# Patient Record
Sex: Female | Born: 1985 | Race: White | Hispanic: No | Marital: Single | Smoking: Former smoker
Health system: Southern US, Community
[De-identification: ages and names within clinical notes are randomized; demographics above are authoritative.]

## PROBLEM LIST (undated history)

## (undated) ENCOUNTER — Inpatient Hospital Stay (HOSPITAL_COMMUNITY): Payer: Self-pay

## (undated) DIAGNOSIS — IMO0002 Reserved for concepts with insufficient information to code with codable children: Secondary | ICD-10-CM

## (undated) DIAGNOSIS — E282 Polycystic ovarian syndrome: Secondary | ICD-10-CM

## (undated) DIAGNOSIS — J45909 Unspecified asthma, uncomplicated: Secondary | ICD-10-CM

## (undated) HISTORY — PX: WISDOM TOOTH EXTRACTION: SHX21

## (undated) HISTORY — PX: DILATION AND CURETTAGE OF UTERUS: SHX78

## (undated) HISTORY — DX: Polycystic ovarian syndrome: E28.2

---

## 2004-05-03 HISTORY — PX: DILATION AND CURETTAGE OF UTERUS: SHX78

## 2012-02-29 ENCOUNTER — Other Ambulatory Visit: Payer: Self-pay | Admitting: Obstetrics & Gynecology

## 2012-02-29 ENCOUNTER — Other Ambulatory Visit: Payer: Self-pay | Admitting: *Deleted

## 2012-03-01 ENCOUNTER — Inpatient Hospital Stay (HOSPITAL_COMMUNITY): Payer: Medicaid Other

## 2012-03-01 ENCOUNTER — Encounter (HOSPITAL_COMMUNITY): Payer: Self-pay | Admitting: *Deleted

## 2012-03-01 ENCOUNTER — Inpatient Hospital Stay (HOSPITAL_COMMUNITY)
Admission: EM | Admit: 2012-03-01 | Discharge: 2012-03-01 | Disposition: A | Discharge status: Home / Self Care | Payer: Medicaid Other | Attending: Emergency Medicine | Admitting: Emergency Medicine

## 2012-03-01 DIAGNOSIS — Z043 Encounter for examination and observation following other accident: Secondary | ICD-10-CM

## 2012-03-01 DIAGNOSIS — W010XXA Fall on same level from slipping, tripping and stumbling without subsequent striking against object, initial encounter: Secondary | ICD-10-CM | POA: Insufficient documentation

## 2012-03-01 DIAGNOSIS — Z79899 Other long term (current) drug therapy: Secondary | ICD-10-CM | POA: Insufficient documentation

## 2012-03-01 DIAGNOSIS — W19XXXA Unspecified fall, initial encounter: Secondary | ICD-10-CM

## 2012-03-01 DIAGNOSIS — Y92009 Unspecified place in unspecified non-institutional (private) residence as the place of occurrence of the external cause: Secondary | ICD-10-CM | POA: Insufficient documentation

## 2012-03-01 DIAGNOSIS — R109 Unspecified abdominal pain: Secondary | ICD-10-CM

## 2012-03-01 DIAGNOSIS — O9989 Other specified diseases and conditions complicating pregnancy, childbirth and the puerperium: Secondary | ICD-10-CM | POA: Insufficient documentation

## 2012-03-01 DIAGNOSIS — Z3490 Encounter for supervision of normal pregnancy, unspecified, unspecified trimester: Secondary | ICD-10-CM

## 2012-03-01 HISTORY — DX: Reserved for concepts with insufficient information to code with codable children: IMO0002

## 2012-03-01 HISTORY — DX: Unspecified asthma, uncomplicated: J45.909

## 2012-03-01 MED ORDER — OXYCODONE-ACETAMINOPHEN 5-325 MG PO TABS
1.0000 | ORAL_TABLET | Freq: Once | ORAL | Status: AC
  Administered 2012-03-01: 1 via ORAL
  Filled 2012-03-01: qty 1

## 2012-03-01 NOTE — ED Notes (Signed)
Rapid response OB nurse in to assess

## 2012-03-01 NOTE — ED Provider Notes (Signed)
History     CSN: [id number]  Arrival date & time 03/01/12  1730   First MD Initiated Contact with Patient 03/01/12 1737       chief complaint: Fall/[redacted] weeks pregnant  The history is provided by the patient.   the patient reports she was walking in her house when she fell down the stairs.  She slid down on her bottom and hit all of the last 5 steps on her way down.  No loss consciousness.  She did not strike her head.  She denies neck pain.  She has no weakness in her upper lower extremities.  She reports developing mild lower abdominal pain after this.  She is a G4 P1 a 2 who is [redacted] weeks pregnant.  No loss of fluid.  No vaginal bleeding.  She has not felt the baby move since the event.  She reports she normally only feels the baby move at night.  Fetal heart rate on arrival to the emergency department looks good in the 140s 150s.  This was a level II trauma code.  She reports only mild lower abdominal pain at this time.  No other complaints  No past medical history on file.  No past surgical history on file.  No family history on file.  History  Substance Use Topics  . Smoking status: Not on file  . Smokeless tobacco: Not on file  . Alcohol Use: Not on file    OB History    No data available      Review of Systems  All other systems reviewed and are negative.    Allergies  Review of patient's allergies indicates no known allergies.  Home Medications   Current Outpatient Rx  Name Route Sig Dispense Refill  . PRENATAL 27-0.8 MG PO TABS Oral Take 1 tablet by mouth daily.      BP 119/68  Pulse 66  Temp 98.3 F (36.8 C) (Oral)  Resp 15  SpO2 99%  Physical Exam  Nursing note and vitals reviewed. Constitutional: She is oriented to person, place, and time. She appears well-developed and well-nourished. No distress.  HENT:  Head: Normocephalic and atraumatic.  Eyes: EOM are normal.  Neck: Normal range of motion.  Cardiovascular: Normal rate, regular rhythm and  normal heart sounds.   Pulmonary/Chest: Effort normal and breath sounds normal.  Abdominal: Soft. She exhibits no distension. There is no tenderness.       Gravid uterus consistent with dates  Musculoskeletal: Normal range of motion.  Neurological: She is alert and oriented to person, place, and time.  Skin: Skin is warm and dry.  Psychiatric: She has a normal mood and affect. Judgment normal.    ED Course  Procedures (including critical care time)  Labs Reviewed - No data to display No results found.   1. Fall   2. Abdominal pain   3. Pregnant       MDM  Patient is clear from a trauma standpoint.  C-spine cleared by Nexus criteria.  No loss consciousness.  Doubt intra-abdominal injury from the fall such as solid organ injury.  Concern for possible placental abruption given her lower abdominal pain.  Baby looks good on fetal monitoring.  The patient will be transferred to [facility] hospital for ongoing fetal monitoring and tocometry  Accepted in transfer by Dr Tamela Oddi        Lyanne Co, MD 03/01/12 [phone]

## 2012-03-01 NOTE — ED Notes (Signed)
Lupita Leash Consulting civil engineer at [facility] MAU notified of patient to be transferred to MAU for further evaluation per Dr. Tamela Oddi.

## 2012-03-01 NOTE — ED Notes (Signed)
Per OB rapid response nurse, pt to be transported to [place] for [place] - accepting MD, Dr. Tamela Oddi

## 2012-03-01 NOTE — ED Notes (Signed)
Notified Dr. Tamela Oddi of patient status Patient to be transferred to [facility] MAU for ultrasound

## 2012-03-01 NOTE — MAU Note (Signed)
Arrived via carelink from [facility]. 20 wks, s/p fall, for U/S.

## 2012-03-01 NOTE — Progress Notes (Signed)
Orthopedic Tech Progress Note Patient Details:  Sheila Brooks [birth year] [id number]  Patient ID: [name], female   DOB: 07-09-1985, 26 y.o.   MRN: [id number] Made trauma visit  Nikki Dom 03/01/2012, 5:44 PM

## 2012-03-01 NOTE — ED Notes (Addendum)
Reports mechanical fall down approx 5 wooden staps - initially landed on bottom then bounced down remaining stairs; no LOC; c/o pain to lower abd - reports is 5 mos preg (G 4, P 1, Ab 2); reports has felt baby move prior to fall but not since; however, states normally feels fetal movement at night; denies any back nor neck pain; no obvious injuries noted; abd obese, soft, nontender; denies any leakage of fluid nor blood; further reports does not feel as if there is anything protruding from vaginal area; states first pregnancy was vaginal delivery; however; did have placenta previa; denies any complications with this pregnancy

## 2012-03-01 NOTE — MAU Provider Note (Signed)
  History     CSN: [id number]  Arrival date and time: 03/01/12 1730   First Provider Initiated Contact with Patient 03/01/12 1857      Chief Complaint  Patient presents with  . Fall   HPI Sheila Brooks 26 y.o. [redacted]w[redacted]d   Client had a fall today where she slipped and sat down on the steps while going down the stairs.  Went to [facility] for evaluation.  Then comes to MAU via carelink per orders of Dr. Tamela Oddi.  Denies any leaking or bleeding.  Is having a constant pain in low midline - "like a headache".  Client confirms the pain is not like cramping but constant in nature.   OB History    Grav Para Term Preterm Abortions TAB SAB Ect Mult Living   1               No past medical history on file.  No past surgical history on file.  No family history on file.  History  Substance Use Topics  . Smoking status: Not on file  . Smokeless tobacco: Not on file  . Alcohol Use: Not on file    Allergies: No Known Allergies  Prescriptions prior to admission  Medication Sig Dispense Refill  . Prenatal Vit-Fe Fumarate-FA (MULTIVITAMIN-PRENATAL) 27-0.8 MG TABS Take 1 tablet by mouth daily.        Review of Systems  Gastrointestinal: Positive for abdominal pain. Negative for nausea and vomiting.  Genitourinary:       No vaginal discharge. No vaginal bleeding. No dysuria.   Physical Exam   Blood pressure 116/62, pulse 72, temperature 98.2 F (36.8 C), temperature source Oral, resp. rate 18, height 5' 3.5" (1.613 m), weight 94.348 kg (208 lb), SpO2 100.00%.  Physical Exam  Nursing note and vitals reviewed. Constitutional: She is oriented to person, place, and time. She appears well-developed and well-nourished.  HENT:  Head: Normocephalic.  Eyes: EOM are normal.  Neck: Neck supple.  GI: Soft. There is tenderness. There is no rebound and no guarding.  Musculoskeletal: Normal range of motion.  Neurological: She is alert and oriented to person, place, and time.  Skin: Skin is  warm and dry.  Psychiatric: She has a normal mood and affect.    MAU Course  Procedures  MDM Care assumed by H. Mathews Robinsons, CNM at 2000  USE:  Clinical Data: Pregnancy with fall. Pain.  LIMITED OBSTETRIC ULTRASOUND  Number of Fetuses: 1  Heart Rate: 136 bpm  Movement: Present  Breathing: Not demonstrated  Presentation: Cephalic  Placental Location: Anterior  Previa: No  Amniotic Fluid (Subjective): Normal  Vertical Pocket 4.03 cm  BPD: 5.07 cm 21 w 3 d EDC: 07/09/2012  MATERNAL FINDINGS:  Cervix: 4.1 cm. Closed.  Uterus/Adnexae: Both ovaries grossly normal  IMPRESSION:  Normal appearing limited exam. Single living intrauterine  gestation at 21 weeks 3 days. No identifiable complication.  Recommend followup with non-emergent complete OB 14+ wk US  examination for fetal biometric evaluation and anatomic survey if  not already performed.    2111: Spoke with Dr. Tamela Oddi, OK to DC patient home.  Assessment and Plan   1. Fall   2. Abdominal pain   3. Pregnant    Danger signs reviewed FU with Dr. Tamela Oddi as scheduled.   BURLESON,TERRI 03/01/2012, 7:02 PM

## 2012-03-23 ENCOUNTER — Other Ambulatory Visit: Payer: Self-pay | Admitting: Obstetrics & Gynecology

## 2012-03-23 LAB — OB RESULTS CONSOLE GC/CHLAMYDIA
Chlamydia: NEGATIVE
Gonorrhea: NEGATIVE

## 2012-03-23 LAB — OB RESULTS CONSOLE ABO/RH

## 2012-03-23 LAB — OB RESULTS CONSOLE HIV ANTIBODY (ROUTINE TESTING): HIV: NONREACTIVE

## 2012-03-23 LAB — OB RESULTS CONSOLE RPR: RPR: NONREACTIVE

## 2012-03-23 LAB — OB RESULTS CONSOLE RUBELLA ANTIBODY, IGM: Rubella: IMMUNE

## 2012-05-15 ENCOUNTER — Inpatient Hospital Stay (HOSPITAL_COMMUNITY)
Admission: AD | Admit: 2012-05-15 | Discharge: 2012-05-15 | Disposition: A | Discharge status: Home / Self Care | Payer: Medicaid Other | Source: Ambulatory Visit | Attending: Obstetrics & Gynecology | Admitting: Obstetrics & Gynecology

## 2012-05-15 DIAGNOSIS — Z2989 Encounter for other specified prophylactic measures: Secondary | ICD-10-CM | POA: Insufficient documentation

## 2012-05-15 DIAGNOSIS — Z298 Encounter for other specified prophylactic measures: Secondary | ICD-10-CM | POA: Insufficient documentation

## 2012-05-15 MED ORDER — RHO D IMMUNE GLOBULIN 1500 UNIT/2ML IJ SOLN
300.0000 ug | Freq: Once | INTRAMUSCULAR | Status: AC
  Administered 2012-05-15: 300 ug via INTRAMUSCULAR
  Filled 2012-05-15: qty 2

## 2012-05-15 NOTE — MAU Note (Signed)
RHophyllac/RH factor info given to pt.  Pt has received previously, no  Problems.  NKDA.  Time associated between blood draw and injection discussed.

## 2012-05-16 LAB — RH IG WORKUP (INCLUDES ABO/RH)
Antibody Screen: NEGATIVE
Fetal Screen: NEGATIVE
Gestational Age(Wks): 32

## 2012-06-10 ENCOUNTER — Inpatient Hospital Stay (HOSPITAL_COMMUNITY)
Admission: AD | Admit: 2012-06-10 | Discharge: 2012-06-11 | Disposition: A | Discharge status: Home / Self Care | Payer: Medicaid Other | Source: Ambulatory Visit | Attending: Obstetrics & Gynecology | Admitting: Obstetrics & Gynecology

## 2012-06-10 DIAGNOSIS — O4700 False labor before 37 completed weeks of gestation, unspecified trimester: Secondary | ICD-10-CM | POA: Insufficient documentation

## 2012-06-10 DIAGNOSIS — R109 Unspecified abdominal pain: Secondary | ICD-10-CM | POA: Insufficient documentation

## 2012-06-10 DIAGNOSIS — O479 False labor, unspecified: Secondary | ICD-10-CM

## 2012-06-10 LAB — URINALYSIS, ROUTINE W REFLEX MICROSCOPIC
Glucose, UA: NEGATIVE mg/dL
Hgb urine dipstick: NEGATIVE
pH: 6 (ref 5.0–8.0)

## 2012-06-10 LAB — URINE MICROSCOPIC-ADD ON

## 2012-06-10 NOTE — Progress Notes (Signed)
walidah cnm notified of SVE result.

## 2012-06-10 NOTE — MAU Note (Signed)
Patient is in with c/o constant lower abdominal cramping. 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 She denies vaginal bleeding or lof. She reports good fetal movement.

## 2012-06-11 ENCOUNTER — Encounter (HOSPITAL_COMMUNITY): Payer: Self-pay | Admitting: Family

## 2012-06-11 DIAGNOSIS — O479 False labor, unspecified: Secondary | ICD-10-CM

## 2012-06-11 NOTE — MAU Provider Note (Signed)
  History     CSN: [id number]  Arrival date and time: 06/10/12 2254   None     Chief Complaint  Patient presents with  . Abdominal Cramping   Abdominal Cramping   Pt is a [id number] here at 36 wks IUP here with report of cramping that started last night.  Pt denies feeling contraction, vaginal bleeding, or leaking of fluid.  Desired to have cervix checked.     Past Medical History  Diagnosis Date  . Asthma     seasonal  . Abnormal Pap smear     Past Surgical History  Procedure Laterality Date  . Dilation and curettage of uterus    . Wisdom tooth extraction      No family history on file.  History  Substance Use Topics  . Smoking status: Current Every Day Smoker -- 0.25 packs/day    Types: Cigarettes  . Smokeless tobacco: Never Used  . Alcohol Use: No    Allergies: No Known Allergies  Prescriptions prior to admission  Medication Sig Dispense Refill  . Prenatal Vit-Fe Fumarate-FA (MULTIVITAMIN-PRENATAL) 27-0.8 MG TABS Take 1 tablet by mouth daily.        Review of Systems  Gastrointestinal: Positive for abdominal pain (cramping).  All other systems reviewed and are negative.   Physical Exam   Blood pressure 121/70, pulse 96, temperature 97.6 F (36.4 C), temperature source Oral, resp. rate 18, height 5' 3.5" (1.613 m), weight 108.92 kg (240 lb 2 oz).  Physical Exam  Constitutional: She is oriented to person, place, and time. She appears well-developed and well-nourished. No distress.  HENT:  Head: Normocephalic.  Neck: Normal range of motion. Neck supple.  Cardiovascular: Normal rate, regular rhythm and normal heart sounds.   Respiratory: Effort normal and breath sounds normal.  GI: Soft. There is no tenderness.  Genitourinary: No bleeding around the vagina. Vaginal discharge (mucusy) found.  Neurological: She is alert and oriented to person, place, and time.  Skin: Skin is warm and dry.  Dilation: Closed Effacement (%): Thick Cervical Position:  Posterior Station: Ballotable Exam by:: Peace, rn   MAU Course  Procedures  Results for orders placed during the hospital encounter of 06/10/12 (from the past 24 hour(s))  URINALYSIS, ROUTINE W REFLEX MICROSCOPIC     Status: Abnormal   Collection Time    06/10/12 11:02 PM      Result Value Range   Color, Urine YELLOW  YELLOW   APPearance CLEAR  CLEAR   Specific Gravity, Urine 1.020  1.005 - 1.030   pH 6.0  5.0 - 8.0   Glucose, UA NEGATIVE  NEGATIVE mg/dL   Hgb urine dipstick NEGATIVE  NEGATIVE   Bilirubin Urine NEGATIVE  NEGATIVE   Ketones, ur NEGATIVE  NEGATIVE mg/dL   Protein, ur NEGATIVE  NEGATIVE mg/dL   Urobilinogen, UA 0.2  0.0 - 1.0 mg/dL   Nitrite NEGATIVE  NEGATIVE   Leukocytes, UA SMALL (*) NEGATIVE  URINE MICROSCOPIC-ADD ON     Status: Abnormal   Collection Time    06/10/12 11:02 PM      Result Value Range   Squamous Epithelial / LPF FEW (*) RARE   WBC, UA 3-6  <3 WBC/hpf   Bacteria, UA FEW (*) RARE   Urine-Other MUCOUS PRESENT     FHR 120's, +accel, reactive Toco - irregular Assessment and Plan  Braxton Hicks  Plan: DC to home Preterm labor precautions Keep scheduled appointment  [facility] 06/11/2012, 12:41 AM

## 2012-06-12 LAB — OB RESULTS CONSOLE GBS: GBS: NEGATIVE

## 2012-06-12 LAB — URINE CULTURE

## 2012-06-26 ENCOUNTER — Encounter (HOSPITAL_COMMUNITY): Payer: Self-pay | Admitting: *Deleted

## 2012-06-26 ENCOUNTER — Telehealth (HOSPITAL_COMMUNITY): Payer: Self-pay | Admitting: *Deleted

## 2012-06-26 NOTE — Telephone Encounter (Signed)
Preadmission screen  

## 2012-07-09 ENCOUNTER — Inpatient Hospital Stay (HOSPITAL_COMMUNITY)
Admission: AD | Admit: 2012-07-09 | Discharge: 2012-07-10 | DRG: 780 | Disposition: A | Discharge status: Home / Self Care | Payer: Medicaid Other | Source: Ambulatory Visit | Attending: Obstetrics & Gynecology | Admitting: Obstetrics & Gynecology

## 2012-07-09 ENCOUNTER — Encounter (HOSPITAL_COMMUNITY): Payer: Self-pay | Admitting: *Deleted

## 2012-07-09 DIAGNOSIS — O479 False labor, unspecified: Principal | ICD-10-CM | POA: Diagnosis present

## 2012-07-09 LAB — CBC
HCT: 32.2 % — ABNORMAL LOW (ref 36.0–46.0)
MCV: 95.3 fL (ref 78.0–100.0)
RBC: 3.38 MIL/uL — ABNORMAL LOW (ref 3.87–5.11)
WBC: 11.6 10*3/uL — ABNORMAL HIGH (ref 4.0–10.5)

## 2012-07-09 MED ORDER — IBUPROFEN 600 MG PO TABS: 600.0000 mg | ORAL_TABLET | Freq: Four times a day (QID) | ORAL | Status: DC | PRN

## 2012-07-09 MED ORDER — ACETAMINOPHEN 325 MG PO TABS: 650.0000 mg | ORAL_TABLET | ORAL | Status: DC | PRN

## 2012-07-09 MED ORDER — CITRIC ACID-SODIUM CITRATE 334-500 MG/5ML PO SOLN: 30.0000 mL | ORAL | Status: DC | PRN

## 2012-07-09 MED ORDER — OXYTOCIN BOLUS FROM INFUSION: 500.0000 mL | INTRAVENOUS | Status: DC

## 2012-07-09 MED ORDER — BUTORPHANOL TARTRATE 1 MG/ML IJ SOLN
1.0000 mg | INTRAMUSCULAR | Status: DC | PRN
  Administered 2012-07-09: 1 mg via INTRAVENOUS
  Filled 2012-07-09: qty 1

## 2012-07-09 MED ORDER — OXYTOCIN 40 UNITS IN LACTATED RINGERS INFUSION - SIMPLE MED
62.5000 mL/h | INTRAVENOUS | Status: DC
  Filled 2012-07-09 (×2): qty 1000

## 2012-07-09 MED ORDER — ZOLPIDEM TARTRATE 5 MG PO TABS
5.0000 mg | ORAL_TABLET | Freq: Every evening | ORAL | Status: DC | PRN
  Administered 2012-07-09: 5 mg via ORAL
  Filled 2012-07-09: qty 1

## 2012-07-09 MED ORDER — LACTATED RINGERS IV SOLN
INTRAVENOUS | Status: DC
  Administered 2012-07-09 (×2): via INTRAVENOUS

## 2012-07-09 MED ORDER — ONDANSETRON HCL 4 MG/2ML IJ SOLN: 4.0000 mg | Freq: Four times a day (QID) | INTRAMUSCULAR | Status: DC | PRN

## 2012-07-09 MED ORDER — LACTATED RINGERS IV SOLN: 500.0000 mL | INTRAVENOUS | Status: DC | PRN

## 2012-07-09 MED ORDER — OXYTOCIN 40 UNITS IN LACTATED RINGERS INFUSION - SIMPLE MED
1.0000 m[IU]/min | INTRAVENOUS | Status: DC
  Administered 2012-07-09: 1 m[IU]/min via INTRAVENOUS

## 2012-07-09 MED ORDER — TERBUTALINE SULFATE 1 MG/ML IJ SOLN: 0.2500 mg | Freq: Once | INTRAMUSCULAR | Status: AC | PRN

## 2012-07-09 MED ORDER — LIDOCAINE HCL (PF) 1 % IJ SOLN: 30.0000 mL | INTRAMUSCULAR | Status: DC | PRN

## 2012-07-09 MED ORDER — OXYCODONE-ACETAMINOPHEN 5-325 MG PO TABS: 1.0000 | ORAL_TABLET | ORAL | Status: DC | PRN

## 2012-07-09 NOTE — Progress Notes (Signed)
Dr. Gaynell Face called to check on pt--orders to allow pt to come off monitors to walk then reevaluate SVE

## 2012-07-09 NOTE — Progress Notes (Addendum)
Orders to stop pitocin at 7pm--allow pt to eat & shower--restart pitocin per MD orders 1by 1 until a max dose of 6 milli-units then hold--NST 2 times per shift and vitals when awake while pitocin is off

## 2012-07-09 NOTE — H&P (Signed)
This is Dr. Francoise Ceo dictating the history and physical on blank blank she's a 27 year old gravida 4 para 1021 at 40 weeks EDC 07/09/2012 she was admitted in labor report to 4 cm membranes intact with a vertex -3 cinches been admitted her contractions have become milder and her GBS is negative Past medical history negative Past surgical history negative Social history negative System review negative Physical exam revealed a well-developed female in early labor HEENT negative Breasts negative Lungs clear to P&A Heart regular rhythm no murmurs no gallops Abdomen term Extremities negative

## 2012-07-09 NOTE — MAU Note (Signed)
Contractions since 2030 

## 2012-07-09 NOTE — Progress Notes (Signed)
Monitors removed for pt to walk

## 2012-07-10 ENCOUNTER — Inpatient Hospital Stay (HOSPITAL_COMMUNITY): Payer: Medicaid Other

## 2012-07-10 NOTE — Progress Notes (Signed)
Sheila Brooks is a 27 y.o. [phone] at [redacted]w[redacted]d by LMP admitted for active labor  Subjective: Comfortable  Objective: BP 116/70  Pulse 72  Temp(Src) 98 F (36.7 C) (Oral)  Resp 18  Ht 5\' 3"  (1.6 m)  Wt 110.043 kg (242 lb 9.6 oz)  BMI 42.99 kg/m2  SpO2 99%      FHT:  FHR: 140 bpm, variability: moderate,  accelerations:  Present,  decelerations:  Absent UC:   irregular, every 5 minutes SVE:   Dilation: 2.5 Effacement (%): Thick Station: -3;-2 Exam by:: [employer]: Lab Results  Component Value Date   WBC 11.6* 07/09/2012   HGB 11.1* 07/09/2012   HCT 32.2* 07/09/2012   MCV 95.3 07/09/2012   PLT 295 07/09/2012    Assessment / Plan: False labor  Labor: See above; d/c Pitocin; U/S for AFI; if AFI OK-->d/c home, IOL @ 41 weeks Preeclampsia:  n/a Fetal Wellbeing:  Category I Pain Control:  n/a I/D:  n/a Anticipated MOD:  NSVD  JACKSON-MOORE,LISA A 07/10/2012, 9:22 AM

## 2012-07-11 ENCOUNTER — Encounter (HOSPITAL_COMMUNITY): Payer: Self-pay | Admitting: *Deleted

## 2012-07-11 ENCOUNTER — Inpatient Hospital Stay (HOSPITAL_COMMUNITY)
Admission: AD | Admit: 2012-07-11 | Discharge: 2012-07-15 | DRG: 766 | Disposition: A | Discharge status: Home / Self Care | Payer: Medicaid Other | Source: Ambulatory Visit | Attending: Obstetrics | Admitting: Obstetrics

## 2012-07-11 DIAGNOSIS — Z98891 History of uterine scar from previous surgery: Secondary | ICD-10-CM

## 2012-07-11 DIAGNOSIS — O480 Post-term pregnancy: Secondary | ICD-10-CM | POA: Diagnosis present

## 2012-07-11 NOTE — Discharge Summary (Signed)
  Physician Discharge Summary  Patient ID: Sheila Brooks MRN: [id number] DOB/AGE: 29/07/1985 28 y.o.  Admit date: 07/09/2012 Discharge date: 07/11/2012  Admission Diagnoses: Active labor  Discharge Diagnoses:  False labor  Discharged Condition: stable  Hospital Course: There was no progressive cervical change despite attempts to augment labor with low dose Pitocin.  An AFI on the day of discharge was normal.  Consults: None  Significant Diagnostic Studies: radiology: Ultrasound: see above  Treatments: see above  Discharge Exam: Blood pressure 116/70, pulse 72, temperature 98 F (36.7 C), temperature source Oral, resp. rate 18, height 5\' 3"  (1.6 m), weight 110.043 kg (242 lb 9.6 oz), SpO2 99.00%. General appearance: alert GI: NT  Disposition: 01-Home or Self Care   Future Appointments Lailana Shira Department Dept Phone   07/16/2012 7:00 AM Wh-Bssched Room [facility] [phone]       Medication List    ASK your doctor about these medications       acetaminophen 500 MG tablet  Commonly known as:  TYLENOL  Take 1,000 mg by mouth daily as needed for pain.     nitrofurantoin (macrocrystal-monohydrate) 100 MG capsule  Commonly known as:  MACROBID  Take 100 mg by mouth 2 (two) times daily. 7  Day course     prenatal multivitamin Tabs  Take 1 tablet by mouth daily at 12 noon.           Follow-up Information   Follow up with [facility] On 07/16/2012. (for inductioin)    Contact information:   [address] [place] [address] [phone]      Signed: Roseanna Rainbow 07/11/2012, 9:16 PM

## 2012-07-11 NOTE — MAU Note (Signed)
Neysa Bonito ,RN in [employer] has agreed to triage the pt

## 2012-07-11 NOTE — MAU Note (Signed)
Pt states she had a gush of clear fluid at 1044pm

## 2012-07-12 ENCOUNTER — Inpatient Hospital Stay (HOSPITAL_COMMUNITY): Payer: Medicaid Other | Admitting: Anesthesiology

## 2012-07-12 ENCOUNTER — Encounter (HOSPITAL_COMMUNITY): Payer: Self-pay | Admitting: Anesthesiology

## 2012-07-12 ENCOUNTER — Encounter (HOSPITAL_COMMUNITY)
Admission: AD | Disposition: A | Discharge status: Home / Self Care | Payer: Self-pay | Source: Ambulatory Visit | Attending: Obstetrics

## 2012-07-12 ENCOUNTER — Encounter (HOSPITAL_COMMUNITY): Payer: Self-pay | Admitting: Obstetrics

## 2012-07-12 LAB — RPR: RPR Ser Ql: NONREACTIVE

## 2012-07-12 LAB — TYPE AND SCREEN
Antibody Screen: POSITIVE
Unit division: 0

## 2012-07-12 LAB — CBC
MCH: 33.1 pg (ref 26.0–34.0)
MCHC: 34.4 g/dL (ref 30.0–36.0)
Platelets: 300 10*3/uL (ref 150–400)

## 2012-07-12 SURGERY — Surgical Case
Anesthesia: Epidural | Site: Abdomen | Wound class: Clean Contaminated

## 2012-07-12 MED ORDER — LIDOCAINE HCL (PF) 1 % IJ SOLN: 30.0000 mL | INTRAMUSCULAR | Status: DC | PRN

## 2012-07-12 MED ORDER — OXYTOCIN BOLUS FROM INFUSION: 500.0000 mL | INTRAVENOUS | Status: DC

## 2012-07-12 MED ORDER — CEFAZOLIN SODIUM-DEXTROSE 2-3 GM-% IV SOLR: 2.0000 g | Freq: Three times a day (TID) | INTRAVENOUS | Status: DC

## 2012-07-12 MED ORDER — TERBUTALINE SULFATE 1 MG/ML IJ SOLN: 0.2500 mg | Freq: Once | INTRAMUSCULAR | Status: DC | PRN

## 2012-07-12 MED ORDER — ZOLPIDEM TARTRATE 5 MG PO TABS: 5.0000 mg | ORAL_TABLET | Freq: Every evening | ORAL | Status: DC | PRN

## 2012-07-12 MED ORDER — IBUPROFEN 600 MG PO TABS: 600.0000 mg | ORAL_TABLET | Freq: Four times a day (QID) | ORAL | Status: DC | PRN

## 2012-07-12 MED ORDER — ONDANSETRON HCL 4 MG PO TABS: 4.0000 mg | ORAL_TABLET | ORAL | Status: DC | PRN

## 2012-07-12 MED ORDER — SCOPOLAMINE 1 MG/3DAYS TD PT72
MEDICATED_PATCH | TRANSDERMAL | Status: AC
  Filled 2012-07-12: qty 1

## 2012-07-12 MED ORDER — CEFAZOLIN SODIUM-DEXTROSE 2-3 GM-% IV SOLR
INTRAVENOUS | Status: AC
  Filled 2012-07-12: qty 50

## 2012-07-12 MED ORDER — MEPERIDINE HCL 25 MG/ML IJ SOLN
INTRAMUSCULAR | Status: AC
  Filled 2012-07-12: qty 1

## 2012-07-12 MED ORDER — LACTATED RINGERS IV SOLN
INTRAVENOUS | Status: DC
  Administered 2012-07-12 (×2): via INTRAVENOUS

## 2012-07-12 MED ORDER — PROMETHAZINE HCL 25 MG/ML IJ SOLN: 25.0000 mg | Freq: Four times a day (QID) | INTRAMUSCULAR | Status: DC | PRN

## 2012-07-12 MED ORDER — EPHEDRINE 5 MG/ML INJ: 10.0000 mg | INTRAVENOUS | Status: DC | PRN

## 2012-07-12 MED ORDER — CEFAZOLIN SODIUM-DEXTROSE 2-3 GM-% IV SOLR
INTRAVENOUS | Status: DC | PRN
  Administered 2012-07-12: 2 g via INTRAVENOUS

## 2012-07-12 MED ORDER — GENTAMICIN SULFATE 40 MG/ML IJ SOLN
Freq: Once | INTRAMUSCULAR | Status: AC
  Administered 2012-07-12: 100 mL via INTRAVENOUS
  Filled 2012-07-12: qty 4.5

## 2012-07-12 MED ORDER — CLINDAMYCIN PHOSPHATE 900 MG/50ML IV SOLN: 900.0000 mg | Freq: Three times a day (TID) | INTRAVENOUS | Status: DC

## 2012-07-12 MED ORDER — SODIUM CHLORIDE 0.9 % IJ SOLN: 3.0000 mL | INTRAMUSCULAR | Status: DC | PRN

## 2012-07-12 MED ORDER — WITCH HAZEL-GLYCERIN EX PADS: 1.0000 "application " | MEDICATED_PAD | CUTANEOUS | Status: DC | PRN

## 2012-07-12 MED ORDER — DIPHENHYDRAMINE HCL 25 MG PO CAPS: 25.0000 mg | ORAL_CAPSULE | Freq: Four times a day (QID) | ORAL | Status: DC | PRN

## 2012-07-12 MED ORDER — ONDANSETRON HCL 4 MG/2ML IJ SOLN: 4.0000 mg | INTRAMUSCULAR | Status: DC | PRN

## 2012-07-12 MED ORDER — KETOROLAC TROMETHAMINE 30 MG/ML IJ SOLN
30.0000 mg | Freq: Four times a day (QID) | INTRAMUSCULAR | Status: AC | PRN
  Administered 2012-07-12: 30 mg via INTRAVENOUS

## 2012-07-12 MED ORDER — LACTATED RINGERS IV SOLN: INTRAVENOUS | Status: DC

## 2012-07-12 MED ORDER — ONDANSETRON HCL 4 MG/2ML IJ SOLN: 4.0000 mg | Freq: Four times a day (QID) | INTRAMUSCULAR | Status: DC | PRN

## 2012-07-12 MED ORDER — SODIUM CHLORIDE 0.9 % IV SOLN
2.0000 g | Freq: Once | INTRAVENOUS | Status: AC
  Administered 2012-07-12: 2 g via INTRAVENOUS
  Filled 2012-07-12: qty 2000

## 2012-07-12 MED ORDER — PHENYLEPHRINE 40 MCG/ML (10ML) SYRINGE FOR IV PUSH (FOR BLOOD PRESSURE SUPPORT)
PREFILLED_SYRINGE | INTRAVENOUS | Status: AC
  Filled 2012-07-12: qty 10

## 2012-07-12 MED ORDER — DIPHENHYDRAMINE HCL 50 MG/ML IJ SOLN: 25.0000 mg | INTRAMUSCULAR | Status: DC | PRN

## 2012-07-12 MED ORDER — SODIUM BICARBONATE 8.4 % IV SOLN
INTRAVENOUS | Status: DC | PRN
  Administered 2012-07-12: 5 mL via EPIDURAL

## 2012-07-12 MED ORDER — LACTATED RINGERS IV SOLN
INTRAVENOUS | Status: DC | PRN
  Administered 2012-07-12 (×2): via INTRAVENOUS

## 2012-07-12 MED ORDER — FENTANYL CITRATE 0.05 MG/ML IJ SOLN: 25.0000 ug | INTRAMUSCULAR | Status: DC | PRN

## 2012-07-12 MED ORDER — LACTATED RINGERS IV SOLN
500.0000 mL | Freq: Once | INTRAVENOUS | Status: AC
  Administered 2012-07-12: 500 mL via INTRAVENOUS

## 2012-07-12 MED ORDER — SCOPOLAMINE 1 MG/3DAYS TD PT72
1.0000 | MEDICATED_PATCH | Freq: Once | TRANSDERMAL | Status: DC
  Administered 2012-07-12: 1.5 mg via TRANSDERMAL

## 2012-07-12 MED ORDER — ONDANSETRON HCL 4 MG/2ML IJ SOLN
INTRAMUSCULAR | Status: DC | PRN
  Administered 2012-07-12: 4 mg via INTRAVENOUS

## 2012-07-12 MED ORDER — NALBUPHINE SYRINGE 5 MG/0.5 ML: 10.0000 mg | INJECTION | INTRAMUSCULAR | Status: DC | PRN

## 2012-07-12 MED ORDER — CLINDAMYCIN PHOSPHATE 900 MG/50ML IV SOLN: 900.0000 mg | Freq: Once | INTRAVENOUS | Status: DC

## 2012-07-12 MED ORDER — SENNOSIDES-DOCUSATE SODIUM 8.6-50 MG PO TABS
2.0000 | ORAL_TABLET | Freq: Every day | ORAL | Status: DC
  Administered 2012-07-14: 2 via ORAL

## 2012-07-12 MED ORDER — LACTATED RINGERS IV SOLN
INTRAVENOUS | Status: DC
  Administered 2012-07-12: 150 mL/h via INTRAUTERINE

## 2012-07-12 MED ORDER — MAGNESIUM HYDROXIDE 400 MG/5ML PO SUSP: 30.0000 mL | ORAL | Status: DC | PRN

## 2012-07-12 MED ORDER — EPHEDRINE 5 MG/ML INJ
10.0000 mg | INTRAVENOUS | Status: DC | PRN
  Filled 2012-07-12: qty 4

## 2012-07-12 MED ORDER — METOCLOPRAMIDE HCL 5 MG/ML IJ SOLN: 10.0000 mg | Freq: Three times a day (TID) | INTRAMUSCULAR | Status: DC | PRN

## 2012-07-12 MED ORDER — PHENYLEPHRINE 40 MCG/ML (10ML) SYRINGE FOR IV PUSH (FOR BLOOD PRESSURE SUPPORT): 80.0000 ug | PREFILLED_SYRINGE | INTRAVENOUS | Status: DC | PRN

## 2012-07-12 MED ORDER — PHENYLEPHRINE HCL 10 MG/ML IJ SOLN
INTRAMUSCULAR | Status: DC | PRN
  Administered 2012-07-12: 80 ug via INTRAVENOUS
  Administered 2012-07-12 (×2): 40 ug via INTRAVENOUS
  Administered 2012-07-12: 80 ug via INTRAVENOUS
  Administered 2012-07-12: 40 ug via INTRAVENOUS

## 2012-07-12 MED ORDER — FLEET ENEMA 7-19 GM/118ML RE ENEM: 1.0000 | ENEMA | RECTAL | Status: DC | PRN

## 2012-07-12 MED ORDER — OXYCODONE-ACETAMINOPHEN 5-325 MG PO TABS: 1.0000 | ORAL_TABLET | ORAL | Status: DC | PRN

## 2012-07-12 MED ORDER — FENTANYL 2.5 MCG/ML BUPIVACAINE 1/10 % EPIDURAL INFUSION (WH - ANES)
14.0000 mL/h | INTRAMUSCULAR | Status: DC | PRN
  Administered 2012-07-12 (×3): 14 mL/h via EPIDURAL
  Filled 2012-07-12 (×3): qty 125

## 2012-07-12 MED ORDER — SIMETHICONE 80 MG PO CHEW
80.0000 mg | CHEWABLE_TABLET | ORAL | Status: DC | PRN
  Administered 2012-07-13 – 2012-07-14 (×2): 80 mg via ORAL

## 2012-07-12 MED ORDER — OXYTOCIN 40 UNITS IN LACTATED RINGERS INFUSION - SIMPLE MED: 62.5000 mL/h | INTRAVENOUS | Status: DC

## 2012-07-12 MED ORDER — CITRIC ACID-SODIUM CITRATE 334-500 MG/5ML PO SOLN
30.0000 mL | ORAL | Status: DC | PRN
  Administered 2012-07-12: 30 mL via ORAL

## 2012-07-12 MED ORDER — GENTAMICIN SULFATE 40 MG/ML IJ SOLN
Freq: Three times a day (TID) | INTRAVENOUS | Status: DC
  Administered 2012-07-13 – 2012-07-14 (×4): via INTRAVENOUS
  Filled 2012-07-12 (×5): qty 4.25

## 2012-07-12 MED ORDER — FERROUS SULFATE 325 (65 FE) MG PO TABS
325.0000 mg | ORAL_TABLET | Freq: Two times a day (BID) | ORAL | Status: DC
  Administered 2012-07-13 – 2012-07-15 (×5): 325 mg via ORAL
  Filled 2012-07-12 (×5): qty 1

## 2012-07-12 MED ORDER — PHENYLEPHRINE 40 MCG/ML (10ML) SYRINGE FOR IV PUSH (FOR BLOOD PRESSURE SUPPORT)
80.0000 ug | PREFILLED_SYRINGE | INTRAVENOUS | Status: DC | PRN
  Filled 2012-07-12: qty 5

## 2012-07-12 MED ORDER — NALOXONE HCL 1 MG/ML IJ SOLN: 1.0000 ug/kg/h | INTRAVENOUS | Status: DC | PRN

## 2012-07-12 MED ORDER — FENTANYL CITRATE 0.05 MG/ML IJ SOLN
INTRAMUSCULAR | Status: AC
  Filled 2012-07-12: qty 2

## 2012-07-12 MED ORDER — NALBUPHINE HCL 10 MG/ML IJ SOLN: 10.0000 mg | Freq: Four times a day (QID) | INTRAMUSCULAR | Status: DC | PRN

## 2012-07-12 MED ORDER — ONDANSETRON HCL 4 MG/2ML IJ SOLN: 4.0000 mg | Freq: Three times a day (TID) | INTRAMUSCULAR | Status: DC | PRN

## 2012-07-12 MED ORDER — DIPHENHYDRAMINE HCL 50 MG/ML IJ SOLN: 12.5000 mg | INTRAMUSCULAR | Status: DC | PRN

## 2012-07-12 MED ORDER — TETANUS-DIPHTH-ACELL PERTUSSIS 5-2.5-18.5 LF-MCG/0.5 IM SUSP: 0.5000 mL | Freq: Once | INTRAMUSCULAR | Status: DC

## 2012-07-12 MED ORDER — FENTANYL CITRATE 0.05 MG/ML IJ SOLN: INTRAMUSCULAR | Status: DC | PRN

## 2012-07-12 MED ORDER — ACETAMINOPHEN 325 MG PO TABS
650.0000 mg | ORAL_TABLET | ORAL | Status: DC | PRN
  Filled 2012-07-12: qty 2

## 2012-07-12 MED ORDER — 0.9 % SODIUM CHLORIDE (POUR BTL) OPTIME
TOPICAL | Status: DC | PRN
  Administered 2012-07-12: 1000 mL

## 2012-07-12 MED ORDER — LIDOCAINE HCL (PF) 1 % IJ SOLN
INTRAMUSCULAR | Status: DC | PRN
  Administered 2012-07-12 (×2): 5 mL

## 2012-07-12 MED ORDER — MEPERIDINE HCL 25 MG/ML IJ SOLN
INTRAMUSCULAR | Status: DC | PRN
  Administered 2012-07-12: 25 mg via INTRAVENOUS

## 2012-07-12 MED ORDER — MORPHINE SULFATE (PF) 0.5 MG/ML IJ SOLN
INTRAMUSCULAR | Status: DC | PRN
  Administered 2012-07-12: 4 mg via EPIDURAL
  Administered 2012-07-12: 1 mg via INTRAVENOUS

## 2012-07-12 MED ORDER — NALBUPHINE HCL 10 MG/ML IJ SOLN: 5.0000 mg | INTRAMUSCULAR | Status: DC | PRN

## 2012-07-12 MED ORDER — LACTATED RINGERS IV SOLN: 500.0000 mL | INTRAVENOUS | Status: DC | PRN

## 2012-07-12 MED ORDER — OXYTOCIN 10 UNIT/ML IJ SOLN
40.0000 [IU] | INTRAVENOUS | Status: DC | PRN
  Administered 2012-07-12: 40 [IU] via INTRAVENOUS

## 2012-07-12 MED ORDER — DIPHENHYDRAMINE HCL 25 MG PO CAPS: 25.0000 mg | ORAL_CAPSULE | ORAL | Status: DC | PRN

## 2012-07-12 MED ORDER — PRENATAL MULTIVITAMIN CH
1.0000 | ORAL_TABLET | Freq: Every day | ORAL | Status: DC
  Administered 2012-07-13 – 2012-07-14 (×2): 1 via ORAL
  Filled 2012-07-12 (×2): qty 1

## 2012-07-12 MED ORDER — MEASLES, MUMPS & RUBELLA VAC ~~LOC~~ INJ: 0.5000 mL | INJECTION | Freq: Once | SUBCUTANEOUS | Status: DC

## 2012-07-12 MED ORDER — KETOROLAC TROMETHAMINE 30 MG/ML IJ SOLN
INTRAMUSCULAR | Status: AC
  Filled 2012-07-12: qty 1

## 2012-07-12 MED ORDER — MORPHINE SULFATE 0.5 MG/ML IJ SOLN
INTRAMUSCULAR | Status: AC
  Filled 2012-07-12: qty 10

## 2012-07-12 MED ORDER — KETOROLAC TROMETHAMINE 30 MG/ML IJ SOLN: 30.0000 mg | Freq: Four times a day (QID) | INTRAMUSCULAR | Status: AC | PRN

## 2012-07-12 MED ORDER — OXYCODONE-ACETAMINOPHEN 5-325 MG PO TABS
1.0000 | ORAL_TABLET | ORAL | Status: DC | PRN
  Administered 2012-07-13 – 2012-07-14 (×2): 1 via ORAL
  Filled 2012-07-12 (×2): qty 1

## 2012-07-12 MED ORDER — DIBUCAINE 1 % RE OINT: 1.0000 "application " | TOPICAL_OINTMENT | RECTAL | Status: DC | PRN

## 2012-07-12 MED ORDER — CITRIC ACID-SODIUM CITRATE 334-500 MG/5ML PO SOLN
30.0000 mL | ORAL | Status: DC | PRN
  Filled 2012-07-12: qty 15

## 2012-07-12 MED ORDER — NALOXONE HCL 0.4 MG/ML IJ SOLN: 0.4000 mg | INTRAMUSCULAR | Status: DC | PRN

## 2012-07-12 MED ORDER — SODIUM CHLORIDE 0.9 % IV SOLN
2.0000 g | Freq: Four times a day (QID) | INTRAVENOUS | Status: DC
  Administered 2012-07-13 – 2012-07-14 (×6): 2 g via INTRAVENOUS
  Filled 2012-07-12 (×7): qty 2000

## 2012-07-12 MED ORDER — FENTANYL CITRATE 0.05 MG/ML IJ SOLN
INTRAMUSCULAR | Status: DC | PRN
  Administered 2012-07-12: 100 ug via EPIDURAL

## 2012-07-12 MED ORDER — OXYTOCIN 40 UNITS IN LACTATED RINGERS INFUSION - SIMPLE MED
1.0000 m[IU]/min | INTRAVENOUS | Status: DC
  Administered 2012-07-12: 1 m[IU]/min via INTRAVENOUS

## 2012-07-12 MED ORDER — IBUPROFEN 600 MG PO TABS
600.0000 mg | ORAL_TABLET | Freq: Four times a day (QID) | ORAL | Status: DC
  Administered 2012-07-13 – 2012-07-15 (×9): 600 mg via ORAL
  Filled 2012-07-12 (×8): qty 1

## 2012-07-12 MED ORDER — LACTATED RINGERS IV SOLN
INTRAVENOUS | Status: DC
  Administered 2012-07-13 (×2): via INTRAVENOUS

## 2012-07-12 MED ORDER — OXYTOCIN 40 UNITS IN LACTATED RINGERS INFUSION - SIMPLE MED: 62.5000 mL/h | INTRAVENOUS | Status: AC

## 2012-07-12 MED ORDER — MEPERIDINE HCL 25 MG/ML IJ SOLN: 6.2500 mg | INTRAMUSCULAR | Status: DC | PRN

## 2012-07-12 MED ORDER — LANOLIN HYDROUS EX OINT: 1.0000 "application " | TOPICAL_OINTMENT | CUTANEOUS | Status: DC | PRN

## 2012-07-12 MED ORDER — OXYTOCIN 40 UNITS IN LACTATED RINGERS INFUSION - SIMPLE MED
62.5000 mL/h | INTRAVENOUS | Status: DC
  Filled 2012-07-12: qty 1000

## 2012-07-12 MED ORDER — ACETAMINOPHEN 325 MG PO TABS
650.0000 mg | ORAL_TABLET | ORAL | Status: DC | PRN
  Administered 2012-07-12: 650 mg via ORAL

## 2012-07-12 SURGICAL SUPPLY — 39 items
BENZOIN TINCTURE PRP APPL 2/3 (GAUZE/BANDAGES/DRESSINGS) ×2 IMPLANT
CANISTER WOUND CARE 500ML ATS (WOUND CARE) IMPLANT
CLOTH BEACON ORANGE TIMEOUT ST (SAFETY) ×2 IMPLANT
CONTAINER PREFILL 10% NBF 15ML (MISCELLANEOUS) IMPLANT
DRAPE LG THREE QUARTER DISP (DRAPES) ×2 IMPLANT
DRSG OPSITE POSTOP 4X10 (GAUZE/BANDAGES/DRESSINGS) ×2 IMPLANT
DRSG VAC ATS LRG SENSATRAC (GAUZE/BANDAGES/DRESSINGS) IMPLANT
DRSG VAC ATS MED SENSATRAC (GAUZE/BANDAGES/DRESSINGS) IMPLANT
DRSG VAC ATS SM SENSATRAC (GAUZE/BANDAGES/DRESSINGS) IMPLANT
DURAPREP 26ML APPLICATOR (WOUND CARE) ×2 IMPLANT
ELECT REM PT RETURN 9FT ADLT (ELECTROSURGICAL) ×2
ELECTRODE REM PT RTRN 9FT ADLT (ELECTROSURGICAL) ×1 IMPLANT
EXTRACTOR VACUUM M CUP 4 TUBE (SUCTIONS) IMPLANT
GLOVE BIO SURGEON STRL SZ 6.5 (GLOVE) ×2 IMPLANT
GOWN STRL REIN XL XLG (GOWN DISPOSABLE) ×4 IMPLANT
KIT ABG SYR 3ML LUER SLIP (SYRINGE) IMPLANT
NEEDLE HYPO 25X5/8 SAFETYGLIDE (NEEDLE) IMPLANT
NS IRRIG 1000ML POUR BTL (IV SOLUTION) ×2 IMPLANT
PACK C SECTION WH (CUSTOM PROCEDURE TRAY) ×2 IMPLANT
PAD OB MATERNITY 4.3X12.25 (PERSONAL CARE ITEMS) ×2 IMPLANT
RTRCTR C-SECT PINK 25CM LRG (MISCELLANEOUS) IMPLANT
SLEEVE SCD COMPRESS KNEE MED (MISCELLANEOUS) IMPLANT
STAPLER VISISTAT 35W (STAPLE) IMPLANT
STRIP CLOSURE SKIN 1/2X4 (GAUZE/BANDAGES/DRESSINGS) ×2 IMPLANT
SUT MNCRL 0 VIOLET CTX 36 (SUTURE) ×2 IMPLANT
SUT MNCRL AB 3-0 PS2 27 (SUTURE) ×2 IMPLANT
SUT MONOCRYL 0 CTX 36 (SUTURE) ×2
SUT PDS AB 0 CTX 36 PDP370T (SUTURE) ×2 IMPLANT
SUT PLAIN 0 NONE (SUTURE) IMPLANT
SUT PLAIN 2 0 XLH (SUTURE) ×2 IMPLANT
SUT VIC AB 0 CTXB 36 (SUTURE) IMPLANT
SUT VIC AB 2-0 CT1 (SUTURE) ×2 IMPLANT
SUT VIC AB 2-0 CT1 27 (SUTURE) ×1
SUT VIC AB 2-0 CT1 TAPERPNT 27 (SUTURE) ×1 IMPLANT
SUT VIC AB 2-0 SH 27 (SUTURE)
SUT VIC AB 2-0 SH 27XBRD (SUTURE) IMPLANT
TOWEL OR 17X24 6PK STRL BLUE (TOWEL DISPOSABLE) ×6 IMPLANT
TRAY FOLEY CATH 14FR (SET/KITS/TRAYS/PACK) IMPLANT
WATER STERILE IRR 1000ML POUR (IV SOLUTION) ×2 IMPLANT

## 2012-07-12 NOTE — OR Nursing (Addendum)
Uterus massaged by S. [place] Charity fundraiser.  Two tubes of cord blood sent to lab.  Foley catheter in upon arrival to OR. Urine color- concentrated.  8cc of blood evacuated from uterus during uterine massage.

## 2012-07-12 NOTE — Progress Notes (Signed)
ANTIBIOTIC CONSULT NOTE - INITIAL  Pharmacy Consult for Gentamicin Indication: Maternal temp   No Known Allergies  Patient Measurements: Height: 5\' 3"  (160 cm) Weight: 240 lb (108.863 kg) IBW/kg (Calculated) : 52.4 Adjusted Body Weight: 69.4kg  Vital Signs: Temp: 99.2 F (37.3 C) (03/12 2155) Temp src: Oral (03/12 2155) BP: 107/66 mmHg (03/12 2155) Pulse Rate: 95 (03/12 2155)  Medical History: Past Medical History  Diagnosis Date  . Abnormal Pap smear   . PCOS (polycystic ovarian syndrome)   . Asthma     seasonal, inhaler last used Nov. 2013    Medications:  Ampicillin 2 gram IV q6h Clindamycin 900mg  IV q8h Assessment: 27 yo F with maternal temp during labor now s/p LTCS due to arrest of dilation.   Goal of Therapy:  Gentamicin peaks 6-74mcg/ml and trough < 1 mcg/ml  Plan:  1. Gentamicin 180mg  IV x 1-- given prior to LTCS. 2. Gentamicin 170mg  IV q8h-- next dose due at 0300 07/13/12. 3. Draw SCr in am. 4. Will continue to follow and assess need for Gentamicin levels based on pt's clinical status.  Thanks!  Claybon Jabs 07/12/2012,10:40 PM

## 2012-07-12 NOTE — Op Note (Signed)
Cesarean Section Procedure Note   ADYA WIRZ   07/11/2012 - 07/12/2012  Indications: Arrest of dilatation, persistent occiput posterior   Pre-operative Diagnosis: Arrest of Dilatation, persistent occiput posterior  Post-operative Diagnosis: Same   Surgeon: Antionette Char A  Assistants: none  Anesthesia: epidural  Procedure Details:  The patient was seen in the Holding Room. The risks, benefits, complications, treatment options, and expected outcomes were discussed with the patient. The patient concurred with the proposed plan, giving informed consent. The patient was identified as [name] and the procedure verified as C-Section Delivery. A Time Out was held and the above information confirmed.  After induction of anesthesia, the patient was draped and prepped in the usual sterile manner. A transverse incision was made and carried down through the subcutaneous tissue to the fascia. The fascial incision was made and extended transversely. The fascia was separated from the underlying rectus tissue superiorly and inferiorly. The peritoneum was identified and entered. The peritoneal incision was extended longitudinally. The utero-vesical peritoneal reflection was incised transversely and the bladder flap was bluntly freed from the lower uterine segment. A low transverse uterine incision was made. Delivered from cephalic presentation was a living newborn female infant. APGAR (1 MIN): 9  APGAR (5 MINS):  9     A cord ph was not sent. The umbilical cord was clamped and cut cord. A sample was obtained for evaluation. The placenta was removed Intact and appeared normal.  Upon inspection of the uterine incision there was a 2 cm left-sided extension through the cervix.  The most lateral/inferior point of the extension was identified.  The uterine incision was closed with running locked sutures of 1-0 Monocryl. A second imbricating layer of the same suture was placed.  Hemostasis was observed.  The paracolic gutters were irrigated. The parieto peritoneum was closed in a running fashion with 2-0 Vicryl.  The fascia was then reapproximated with running sutures of 0 Vicryl.  The subcutaneous dead space was obliterated with a running suture of 2 - 0 Plain gut.  The skin was closed with suture.  Instrument, sponge, and needle counts were correct prior the abdominal closure and were correct at the conclusion of the case.    Findings: Direct OP position  Estimated Blood Loss: 600 ml  Total IV Fluids: ml   Urine Output: per Anesthesiology  Specimens: Placenta  Complications: extension of the uterine incision  Disposition: PACU - hemodynamically stable.  Maternal Condition: stable   Baby condition / location:  nursery-stable    Signed: Surgeon(s): Antionette Char, MD

## 2012-07-12 NOTE — Progress Notes (Signed)
Sheila Brooks is a 27 y.o. [phone] at [redacted]w[redacted]d by LMP admitted for rupture of membranes  Subjective:   Objective: BP 118/66  Pulse 93  Temp(Src) 98 F (36.7 C) (Oral)  Resp 20  Ht 5\' 3"  (1.6 m)  Wt 240 lb (108.863 kg)  BMI 42.52 kg/m2  SpO2 100%      FHT:  FHR: 150 bpm, variability: moderate,  accelerations:  Present,  decelerations:  Absent UC:   regular, every 3-5 minutes SVE:   Dilation: 4 Effacement (%): 70 Station: -2 Exam by:: [employer]: Lab Results  Component Value Date   WBC 11.6* 07/09/2012   HGB 11.1* 07/09/2012   HCT 32.2* 07/09/2012   MCV 95.3 07/09/2012   PLT 295 07/09/2012    Assessment / Plan: Spontaneous labor, progressing normally  Labor: Progressing normally Preeclampsia:  n/a Fetal Wellbeing:  Category I Pain Control:  Epidural I/D:  n/a Anticipated MOD:  NSVD  HARPER,CHARLES A 07/12/2012, 1:53 AM

## 2012-07-12 NOTE — Anesthesia Preprocedure Evaluation (Addendum)
Anesthesia Evaluation  Patient identified by MRN, date of birth, ID band Patient awake    Reviewed: Allergy & Precautions, H&P , Patient's Chart, lab work & pertinent test results  History of Anesthesia Complications (+) PONV  Airway Mallampati: III TM Distance: >3 FB Neck ROM: full    Dental no notable dental hx.    Pulmonary neg pulmonary ROS, asthma ,  breath sounds clear to auscultation  Pulmonary exam normal       Cardiovascular negative cardio ROS  Rhythm:regular Rate:Normal     Neuro/Psych negative neurological ROS  negative psych ROS   GI/Hepatic negative GI ROS, Neg liver ROS,   Endo/Other  negative endocrine ROSMorbid obesity  Renal/GU negative Renal ROS     Musculoskeletal   Abdominal   Peds  Hematology negative hematology ROS (+)   Anesthesia Other Findings   Reproductive/Obstetrics (+) Pregnancy                          Anesthesia Physical Anesthesia Plan  ASA: III and emergent  Anesthesia Plan: Epidural   Post-op Pain Management:    Induction:   Airway Management Planned:   Additional Equipment:   Intra-op Plan:   Post-operative Plan:   Informed Consent: I have reviewed the patients History and Physical, chart, labs and discussed the procedure including the risks, benefits and alternatives for the proposed anesthesia with the patient or authorized representative who has indicated his/her understanding and acceptance.     Plan Discussed with: Anesthesiologist, CRNA and Surgeon  Anesthesia Plan Comments: (Patient for C/Section for arrest of dilation.)       Anesthesia Quick Evaluation

## 2012-07-12 NOTE — Progress Notes (Signed)
redosed by CRNA per Dr. Malen Gauze

## 2012-07-12 NOTE — H&P (Signed)
Sheila Brooks is a 27 y.o. female presenting for SROM and UC's. Maternal Medical History:  Reason for admission: Rupture of membranes and contractions.  26 y o G4 P1021.  EDC 07-08-12.  Presents with leaking and UC's.  Contractions: Onset was 6-12 hours ago.   Frequency: regular.   Perceived severity is moderate.    Fetal activity: Perceived fetal activity is normal.   Last perceived fetal movement was within the past hour.    Prenatal complications: no prenatal complications Prenatal Complications - Diabetes: none.    OB History   Grav Para Term Preterm Abortions TAB SAB Ect Mult Living   4 1 1  2 1 1   1      Past Medical History  Diagnosis Date  . Abnormal Pap smear   . PCOS (polycystic ovarian syndrome)   . Asthma     seasonal, inhaler last used Nov. 2013   Past Surgical History  Procedure Laterality Date  . Wisdom tooth extraction    . Dilation and curettage of uterus  2006    retained placenta with SAB, hemorrhage  . Dilation and curettage of uterus     Family History: family history includes COPD in her maternal grandfather and paternal grandfather; Cancer in her maternal aunt, maternal grandfather, maternal grandmother, maternal uncle, and paternal grandmother; Heart disease in her father; Hyperlipidemia in her mother; Hypertension in her maternal grandfather, mother, and paternal grandmother; Kidney Stones in her maternal aunt; Rheum arthritis in her paternal grandmother; Stroke in her maternal grandfather and paternal grandmother; and Vision loss in her maternal grandmother and paternal grandmother. Social History:  reports that she quit smoking about 4 months ago. Her smoking use included Cigarettes. She smoked 0.25 packs per day. She has never used smokeless tobacco. She reports that she does not drink alcohol or use illicit drugs.   Prenatal Transfer Tool  Maternal Diabetes: No Genetic Screening: Normal Maternal Ultrasounds/Referrals: Normal Fetal Ultrasounds or  other Referrals:  None Maternal Substance Abuse:  No Significant Maternal Medications:  Meds include: Other:  PNV Significant Maternal Lab Results:  Lab values include: Group B Strep negative Other Comments:  None  Review of Systems  All other systems reviewed and are negative.      Blood pressure 118/66, pulse 93, temperature 98 F (36.7 C), temperature source Oral, resp. rate 20, height 5\' 3"  (1.6 m), weight 240 lb (108.863 kg), SpO2 100.00%. Maternal Exam:  Abdomen: Patient reports no abdominal tenderness. Fetal presentation: vertex  Introitus: Normal vulva. Normal vagina.  Pelvis: adequate for delivery.   Cervix: Cervix evaluated by digital exam.     Physical Exam  Nursing note and vitals reviewed. Constitutional: She is oriented to person, place, and time. She appears well-developed and well-nourished.  HENT:  Head: Normocephalic and atraumatic.  Eyes: Conjunctivae are normal. Pupils are equal, round, and reactive to light.  Neck: Normal range of motion. Neck supple.  Cardiovascular: Normal rate and regular rhythm.   Respiratory: Effort normal and breath sounds normal.  GI: Soft.  Genitourinary: Vagina normal and uterus normal.  Musculoskeletal: Normal range of motion.  Neurological: She is alert and oriented to person, place, and time.  Skin: Skin is warm and dry.  Psychiatric: She has a normal mood and affect. Her behavior is normal. Judgment and thought content normal.    Prenatal labs: ABO, Rh: --/--/A NEG (03/09 0420) Antibody: POS (03/09 0420) Rubella: Immune (11/21 0000) RPR: NON REACTIVE (03/09 0422)  HBsAg: Negative (11/21 0000)  HIV: Non-reactive (  11/21 0000)  GBS: Negative (02/10 0000)   Assessment/Plan: 40.3 weeks.  SROM.  Early labor.  Admit.   HARPER,CHARLES A 07/12/2012, 1:44 AM

## 2012-07-12 NOTE — Progress Notes (Signed)
Sheila Brooks is a 27 y.o. [phone] at [redacted]w[redacted]d by LMP admitted for rupture of membranes  Subjective: Comfortable  Objective: BP 102/53  Pulse 81  Temp(Src) 98 F (36.7 C) (Oral)  Resp 20  Ht 5\' 3"  (1.6 m)  Wt 108.863 kg (240 lb)  BMI 42.52 kg/m2  SpO2 98%      FHT:  FHR: 140 bpm, variability: moderate,  accelerations:  Present,  decelerations:  Present variable deceleration x 1, moderate, 1 - 2 min UC:   irregular, every 7 minutes SVE:   Dilation: 4 Effacement (%): 70 Station: -2 Exam by:: Jimmye Norman RN IUPC placed; AROM forebag Labs: Lab Results  Component Value Date   WBC 11.3* 07/12/2012   HGB 11.4* 07/12/2012   HCT 33.1* 07/12/2012   MCV 96.2 07/12/2012   PLT 300 07/12/2012    Assessment / Plan: Protracted latent phase  Labor: see above Preeclampsia:  n/a Fetal Wellbeing:  Category II Pain Control:  Epidural I/D:  n/a Anticipated MOD:  NSVD  JACKSON-MOORE,LISA A 07/12/2012, 8:22 AM

## 2012-07-12 NOTE — Anesthesia Procedure Notes (Signed)
Epidural Patient location during procedure: OB Start time: 07/12/2012 2:46 AM  Staffing Anesthesiologist: Angus Seller., Harrell Gave. Performed by: anesthesiologist   Preanesthetic Checklist Completed: patient identified, site marked, surgical consent, pre-op evaluation, timeout performed, IV checked, risks and benefits discussed and monitors and equipment checked  Epidural Patient position: sitting Prep: site prepped and draped and DuraPrep Patient monitoring: continuous pulse ox and blood pressure Approach: midline Injection technique: LOR air and LOR saline  Needle:  Needle type: Tuohy  Needle gauge: 17 G Needle length: 9 cm and 9 Needle insertion depth: 6 cm Catheter type: closed end flexible Catheter size: 19 Gauge Catheter at skin depth: 12 cm Test dose: negative  Assessment Events: blood not aspirated, injection not painful, no injection resistance, negative IV test and no paresthesia  Additional Notes Patient identified.  Risk benefits discussed including failed block, incomplete pain control, headache, nerve damage, paralysis, blood pressure changes, nausea, vomiting, reactions to medication both toxic or allergic, and postpartum back pain.  Patient expressed understanding and wished to proceed.  All questions were answered.  Sterile technique used throughout procedure and epidural site dressed with sterile barrier dressing. No paresthesia or other complications noted.The patient did not experience any signs of intravascular injection such as tinnitus or metallic taste in mouth nor signs of intrathecal spread such as rapid motor block. Please see nursing notes for vital signs.

## 2012-07-12 NOTE — Transfer of Care (Signed)
Immediate Anesthesia Transfer of Care Note  Patient: Sheila Brooks  Procedure(s) Performed: Procedure(s): Primary cesarean section with delivery of baby girl at 80. Apgars 9/9. (N/A)  Patient Location: PACU  Anesthesia Type:Epidural  Level of Consciousness: awake, alert  and oriented  Airway & Oxygen Therapy: Patient Spontanous Breathing  Post-op Assessment: Report given to PACU RN and Post -op Vital signs reviewed and stable  Post vital signs: Reviewed and stable  Complications: No apparent anesthesia complications

## 2012-07-12 NOTE — Anesthesia Postprocedure Evaluation (Signed)
  Anesthesia Post-op Note  Patient: Sheila Brooks  Procedure(s) Performed: Procedure(s) (LRB): Primary cesarean section with delivery of baby girl at 31. Apgars 9/9. (N/A)  Patient Location: PACU  Anesthesia Type: Epidural  Level of Consciousness: awake and alert   Airway and Oxygen Therapy: Patient Spontanous Breathing  Post-op Pain: mild  Post-op Assessment: Post-op Vital signs reviewed, Patient's Cardiovascular Status Stable, Respiratory Function Stable, Patent Airway and No signs of Nausea or vomiting  Last Vitals:  Filed Vitals:   07/12/12 1801  BP: 112/56  Pulse: 170  Temp: 38 C  Resp: 20    Post-op Vital Signs: stable   Complications: No apparent anesthesia complications

## 2012-07-13 ENCOUNTER — Encounter (HOSPITAL_COMMUNITY): Payer: Self-pay | Admitting: Obstetrics & Gynecology

## 2012-07-13 LAB — CBC
Hemoglobin: 9.8 g/dL — ABNORMAL LOW (ref 12.0–15.0)
MCH: 32.9 pg (ref 26.0–34.0)
MCHC: 34.1 g/dL (ref 30.0–36.0)
Platelets: 239 10*3/uL (ref 150–400)
RBC: 2.98 MIL/uL — ABNORMAL LOW (ref 3.87–5.11)

## 2012-07-13 LAB — CREATININE, SERUM
Creatinine, Ser: 0.57 mg/dL (ref 0.50–1.10)
GFR calc non Af Amer: >90 mL/min (ref >90)

## 2012-07-13 MED ORDER — RHO D IMMUNE GLOBULIN 1500 UNIT/2ML IJ SOLN
300.0000 ug | Freq: Once | INTRAMUSCULAR | Status: AC
  Administered 2012-07-13: 300 ug via INTRAVENOUS
  Filled 2012-07-13: qty 2

## 2012-07-13 NOTE — Progress Notes (Signed)
UR chart review completed.  

## 2012-07-13 NOTE — Anesthesia Postprocedure Evaluation (Signed)
  Anesthesia Post-op Note  Patient: Sheila Brooks  Procedure(s) Performed: Procedure(s): Primary cesarean section with delivery of baby girl at 106. Apgars 9/9. (N/A)  Patient Location: Mother/Baby  Anesthesia Type:Epidural  Level of Consciousness: awake  Airway and Oxygen Therapy: Patient Spontanous Breathing  Post-op Pain: none  Post-op Assessment: Patient's Cardiovascular Status Stable, Respiratory Function Stable, Patent Airway, No signs of Nausea or vomiting, Adequate PO intake, Pain level controlled, No headache, No backache, No residual numbness and No residual motor weakness  Post-op Vital Signs: Reviewed and stable  Complications: No apparent anesthesia complications

## 2012-07-13 NOTE — Progress Notes (Signed)
Subjective: Postpartum Day 1: Cesarean Delivery Patient reports no complaints.  Objective: Vital signs in last 24 hours: Temp:  [97.9 F (36.6 C)-100.4 F (38 C)] 98 F (36.7 C) (03/13 0706) Pulse Rate:  [65-170] 65 (03/13 0706) Resp:  [18-25] 20 (03/13 0706) BP: (93-147)/(37-87) 95/57 mmHg (03/13 0706) SpO2:  [93 %-97 %] 95 % (03/13 0706)  Physical Exam:  General: alert and no distress Lochia: appropriate Uterine Fundus: firm Incision: healing well DVT Evaluation: No evidence of DVT seen on physical exam.   Recent Labs  07/12/12 0125 07/13/12 0630  HGB 11.4* 9.8*  HCT 33.1* 28.7*    Assessment/Plan: Status post Cesarean section. Doing well postoperatively.  Continue current care.  HARPER,CHARLES A 07/13/2012, 8:39 AM

## 2012-07-14 LAB — RH IG WORKUP (INCLUDES ABO/RH)
ABO/RH(D): A NEG
Unit division: 0

## 2012-07-14 NOTE — Progress Notes (Signed)
Subjective: Postpartum Day 2: Cesarean Delivery Patient reports tolerating PO, + flatus, + BM and no problems voiding.    Objective: Vital signs in last 24 hours: Temp:  [98.1 F (36.7 C)-98.5 F (36.9 C)] 98.5 F (36.9 C) (03/14 0600) Pulse Rate:  [65-87] 87 (03/14 0600) Resp:  [18-20] 18 (03/14 0600) BP: (98-116)/(52-67) 98/52 mmHg (03/14 0600) SpO2:  [96 %-98 %] 98 % (03/13 1940)  Physical Exam:  General: alert and no distress Lochia: appropriate Uterine Fundus: firm Incision: healing well DVT Evaluation: No evidence of DVT seen on physical exam.   Recent Labs  07/12/12 0125 07/13/12 0630  HGB 11.4* 9.8*  HCT 33.1* 28.7*    Assessment/Plan: Status post Cesarean section. Doing well postoperatively.  Continue current care.  HARPER,CHARLES A 07/14/2012, 8:34 AM

## 2012-07-15 MED ORDER — OXYCODONE-ACETAMINOPHEN 5-325 MG PO TABS: 1.0000 | ORAL_TABLET | ORAL | Status: DC | PRN

## 2012-07-15 MED ORDER — FUSION PLUS PO CAPS: 1.0000 | ORAL_CAPSULE | Freq: Every day | ORAL | Status: DC

## 2012-07-15 MED ORDER — IBUPROFEN 600 MG PO TABS: 600.0000 mg | ORAL_TABLET | Freq: Four times a day (QID) | ORAL | Status: DC | PRN

## 2012-07-15 NOTE — Discharge Summary (Signed)
Obstetric Discharge Summary Reason for Admission: rupture of membranes Prenatal Procedures: ultrasound Intrapartum Procedures: cesarean: low cervical, transverse Postpartum Procedures: none Complications-Operative and Postpartum: none Hemoglobin  Date Value Range Status  07/13/2012 9.8* 12.0 - 15.0 g/dL Final     HCT  Date Value Range Status  07/13/2012 28.7* 36.0 - 46.0 % Final    Physical Exam:  General: alert and no distress Lochia: appropriate Uterine Fundus: firm Incision: healing well DVT Evaluation: No evidence of DVT seen on physical exam.  Discharge Diagnoses: Term Pregnancy-delivered  Discharge Information: Date: 07/15/2012 Activity: pelvic rest Diet: routine Medications: PNV, Ibuprofen, Colace, Iron and Percocet Condition: stable Instructions: refer to practice specific booklet Discharge to: home Follow-up Information   Follow up with Antionette Char A, MD. Schedule an appointment as soon as possible for a visit in 2 weeks.   Contact information:   [address], [address] [place] [address] [phone]       Newborn Data: Live born female  Birth Weight: 8 lb 2.3 oz (3695 g) APGAR: ,   Home with mother.  HARPER,CHARLES A 07/15/2012, 9:41 AM

## 2012-07-15 NOTE — Progress Notes (Signed)
Subjective: Postpartum Day 3: Cesarean Delivery Patient reports tolerating PO, + flatus, + BM and no problems voiding.    Objective: Vital signs in last 24 hours: Temp:  [97.7 F (36.5 C)-98.2 F (36.8 C)] 97.7 F (36.5 C) (03/15 0607) Pulse Rate:  [76-84] 84 (03/15 0607) Resp:  [16-18] 16 (03/15 0607) BP: (106-112)/(52-58) 106/52 mmHg (03/15 0607) SpO2:  [94 %] 94 % (03/15 4098)  Physical Exam:  General: alert Lochia: appropriate Uterine Fundus: firm Incision: healing well DVT Evaluation: No evidence of DVT seen on physical exam.   Recent Labs  07/13/12 0630  HGB 9.8*  HCT 28.7*    Assessment/Plan: Status post Cesarean section. Doing well postoperatively.  Discharge home with standard precautions and return to clinic in 2 weeks.  HARPER,CHARLES A 07/15/2012, 9:30 AM

## 2012-07-16 ENCOUNTER — Inpatient Hospital Stay (HOSPITAL_COMMUNITY): Admit: 2012-07-16 | Payer: Medicaid Other

## 2012-07-16 LAB — TYPE AND SCREEN
Antibody Screen: POSITIVE
DAT, IgG: NEGATIVE
Unit division: 0

## 2012-07-27 ENCOUNTER — Ambulatory Visit: Payer: Self-pay | Admitting: Obstetrics & Gynecology

## 2012-08-01 ENCOUNTER — Encounter (HOSPITAL_COMMUNITY): Payer: Self-pay | Admitting: *Deleted

## 2012-08-07 ENCOUNTER — Ambulatory Visit (INDEPENDENT_AMBULATORY_CARE_PROVIDER_SITE_OTHER): Payer: Medicaid Other | Admitting: Obstetrics & Gynecology

## 2012-08-07 ENCOUNTER — Encounter: Payer: Self-pay | Admitting: Obstetrics & Gynecology

## 2012-08-07 NOTE — Patient Instructions (Signed)
Intrauterine Device Information  An intrauterine device (IUD) is inserted into your uterus and prevents pregnancy. There are 2 types of IUDs available:  · Copper IUD. This type of IUD is wrapped in copper wire and is placed inside the uterus. Copper makes the uterus and fallopian tubes produce a fluid that kills sperm. The copper IUD can stay in place for 10 years.  · Hormone IUD. This type of IUD contains the hormone progestin (synthetic progesterone). The hormone thickens the cervical mucus and prevents sperm from entering the uterus, and it also thins the uterine lining to prevent implantation of a fertilized egg. The hormone can weaken or kill the sperm that get into the uterus. The hormone IUD can stay in place for 5 years.  Your caregiver will make sure you are a good candidate for a contraceptive IUD. Discuss with your caregiver the possible side effects.  ADVANTAGES  · It is highly effective, reversible, long-acting, and low maintenance.  · There are no estrogen-related side effects.  · An IUD can be used when breastfeeding.  · It is not associated with weight gain.  · It works immediately after insertion.  · The copper IUD does not interfere with your female hormones.  · The progesterone IUD can make heavy menstrual periods lighter.  · The progesterone IUD can be used for 5 years.  · The copper IUD can be used for 10 years.  DISADVANTAGES  · The progesterone IUD can be associated with irregular bleeding patterns.  · The copper IUD can make your menstrual flow heavier and more painful.  · You may experience cramping and vaginal bleeding after insertion.  Document Released: 03/23/2004 Document Revised: 07/12/2011 Document Reviewed: 08/22/2010  ExitCare® Patient Information ©2013 [employer].

## 2012-08-07 NOTE — Progress Notes (Signed)
Subjective:     Sheila Brooks is a 27 y.o. female who presents for a postpartum visit. She is 3 weeks postpartum following a low cervical transverse Cesarean section. I have fully reviewed the prenatal and intrapartum course. The delivery was at 40.3 gestational weeks. Outcome: primary cesarean section, low transverse incision. Anesthesia: epidural. Postpartum course has been normal. Baby's course has been normal. Baby is feeding by breast. Bleeding pink. Bowel function is normal. Bladder function is normal. Patient is not sexually active. Contraception method is abstinence. Postpartum depression screening: negative. Pt states she is having a stinging pain on the left side on her incision. Pt states she has a numbness in her lower back on the left side.  The following portions of the patient's history were reviewed and updated as appropriate: allergies, current medications, past family history, past medical history, past social history, past surgical history and problem list.  Review of Systems Pertinent items are noted in HPI.    Objective:    BP 115/81  Pulse 74  Temp(Src) 98.1 F (36.7 C)  Ht 5\' 3"  (1.6 m)  Wt 213 lb (96.616 kg)  BMI 37.74 kg/m2  Breastfeeding? Yes  General:  alert        Abdomen: incision healing well.  Skin suture knot removed.                 Assessment:     Normal postpartum exam.   Plan:    1. Contraception: plans copper T IUD  2. Follow up in: 3 weeks or as needed.

## 2012-08-08 ENCOUNTER — Encounter: Payer: Self-pay | Admitting: Obstetrics & Gynecology

## 2012-08-08 LAB — GC/CHLAMYDIA PROBE AMP, URINE: GC Probe Amp, Urine: NEGATIVE

## 2012-08-23 ENCOUNTER — Ambulatory Visit: Payer: Self-pay | Admitting: Obstetrics & Gynecology

## 2012-08-30 ENCOUNTER — Ambulatory Visit: Payer: Medicaid Other | Admitting: Obstetrics & Gynecology

## 2012-09-06 ENCOUNTER — Ambulatory Visit: Payer: Medicaid Other | Admitting: Obstetrics & Gynecology

## 2013-06-07 ENCOUNTER — Encounter (HOSPITAL_COMMUNITY): Payer: Self-pay

## 2013-06-07 ENCOUNTER — Inpatient Hospital Stay (HOSPITAL_COMMUNITY)
Admission: AD | Admit: 2013-06-07 | Discharge: 2013-06-07 | Disposition: A | Discharge status: Home / Self Care | Payer: Medicaid Other | Source: Ambulatory Visit | Attending: Obstetrics & Gynecology | Admitting: Obstetrics & Gynecology

## 2013-06-07 DIAGNOSIS — R109 Unspecified abdominal pain: Secondary | ICD-10-CM | POA: Insufficient documentation

## 2013-06-07 DIAGNOSIS — O0930 Supervision of pregnancy with insufficient antenatal care, unspecified trimester: Secondary | ICD-10-CM | POA: Insufficient documentation

## 2013-06-07 DIAGNOSIS — O99891 Other specified diseases and conditions complicating pregnancy: Secondary | ICD-10-CM | POA: Insufficient documentation

## 2013-06-07 DIAGNOSIS — E860 Dehydration: Secondary | ICD-10-CM

## 2013-06-07 DIAGNOSIS — R42 Dizziness and giddiness: Secondary | ICD-10-CM | POA: Insufficient documentation

## 2013-06-07 DIAGNOSIS — O9989 Other specified diseases and conditions complicating pregnancy, childbirth and the puerperium: Principal | ICD-10-CM

## 2013-06-07 DIAGNOSIS — Z87891 Personal history of nicotine dependence: Secondary | ICD-10-CM | POA: Insufficient documentation

## 2013-06-07 DIAGNOSIS — M62838 Other muscle spasm: Secondary | ICD-10-CM

## 2013-06-07 NOTE — MAU Note (Signed)
Patient states she has had no prenatal care. States she thinks she is a least 20 weeks but not sure. States she has been having upper left to left side pain. Denies bleeding or leaking. Denies S/S of the flu. States she has felt fetal movement.

## 2013-06-07 NOTE — MAU Provider Note (Signed)
@MAUPATCONTACT @  Chief Complaint:  Abdominal Pain   Sheila Brooks is  28 y.o. [id number] at [redacted]w[redacted]d presents complaining of Abdominal Pain .  Pt states she first felt the pain yesterday on her left side after lifting up her 74 month old daughter. She says it was a sharp pull that felt like she strained a muscle. It is aggravated by movt and relieved by rest and tylenol. Currently rates the pain 6/10 after taking tylenol. But 9/10 without tylenol. Also complains of episodes of (spotty vision+dizziness+headache afterwards) after standing up too fast. She states that this has been going on a while and she had similar episodes during her last pregnancy. Denies SOB, epigastric/RUQ pain or swelling. Reports good fetal movt. denies contractions, vaginal bleeding or loss of fluid. Pregnancy history is significant for lack of prenatal care due to personal family problems. Note that pt is considering putting baby up for adoption. Obstetrical/Gynecological History: OB History   Grav Para Term Preterm Abortions TAB SAB Ect Mult Living   5 2 2  2 1 1   2      Past Medical History: Past Medical History  Diagnosis Date  . Abnormal Pap smear   . PCOS (polycystic ovarian syndrome)   . Asthma     seasonal, inhaler last used Nov. 2013    Past Surgical History: Past Surgical History  Procedure Laterality Date  . Wisdom tooth extraction    . Dilation and curettage of uterus  2006    retained placenta with SAB, hemorrhage  . Dilation and curettage of uterus    . Cesarean section N/A 07/12/2012    Procedure: Primary cesarean section with delivery of baby girl at 1900. Apgars 9/9.;  Surgeon: Antionette Char, MD;  Location: [facility];  Service: Obstetrics;  Laterality: N/A;    Family History: Family History  Problem Relation Age of Onset  . Hyperlipidemia Mother   . Hypertension Mother   . Heart disease Father   . Kidney Stones Maternal Aunt   . Cancer Maternal Aunt     melanoma  . Cancer Maternal  Uncle     melanoma  . Vision loss Maternal Grandmother   . Cancer Maternal Grandmother     melanoma  . COPD Maternal Grandfather   . Cancer Maternal Grandfather     lung  . Stroke Maternal Grandfather   . Hypertension Maternal Grandfather   . Rheum arthritis Paternal Grandmother   . Vision loss Paternal Grandmother   . Stroke Paternal Grandmother   . Cancer Paternal Grandmother     breast  . Hypertension Paternal Grandmother   . COPD Paternal Grandfather     Social History: History  Substance Use Topics  . Smoking status: Former Smoker -- 0.25 packs/day    Types: Cigarettes    Quit date: 02/24/2012  . Smokeless tobacco: Never Used  . Alcohol Use: No    Allergies: No Known Allergies  Meds:  Prescriptions prior to admission  Medication Sig Dispense Refill  . ibuprofen (ADVIL,MOTRIN) 600 MG tablet Take 1 tablet (600 mg total) by mouth every 6 (six) hours as needed for pain.  30 tablet  5  . Iron-FA-B Cmp-C-Biot-Probiotic (FUSION PLUS) CAPS Take 1 capsule by mouth daily before breakfast.  30 capsule  5  . nitrofurantoin, macrocrystal-monohydrate, (MACROBID) 100 MG capsule Take 100 mg by mouth 2 (two) times daily. 7  Day course      . oxyCODONE-acetaminophen (PERCOCET/ROXICET) 5-325 MG per tablet Take 1-2 tablets by mouth every 4 (four)  hours as needed for pain.  40 tablet  0  . Prenatal Vit-Fe Fumarate-FA (PRENATAL MULTIVITAMIN) TABS Take 1 tablet by mouth daily at 12 noon.        Review of Systems -   Review of Systems  Constitutional: Negative for fever HENT: Negative for hearing loss, ear pain, nosebleeds, congestion, sore throat, neck pain, tinnitus and ear discharge.   Eyes:Positive for spots in visual fields. Negative for double vision, photophobia, pain, discharge and redness.  Respiratory: Negative for cough, hemoptysis, sputum production, shortness of breath, wheezing and stridor.   Cardiovascular: Negative for chest pain, palpitations, orthopnea,  leg swelling   Gastrointestinal: Positive for abdominal pain. Negative for heartburn, nausea, vomiting, diarrhea, constipation, blood in stool Genitourinary: Negative for dysuria, urgency, frequency, hematuria and flank pain.  Musculoskeletal: Negative for myalgias, back pain, joint pain and falls.    Physical Exam  Blood pressure 119/68, pulse 87, temperature 98.5 F (36.9 C), temperature source Oral, resp. rate 16, height 5\' 3"  (1.6 m), weight 94.62 kg (208 lb 9.6 oz), last menstrual period 12/26/2012, SpO2 100.00%. GENERAL: Well-developed, well-nourished female in no acute distress.  LUNGS: Clear to auscultation bilaterally.  HEART: Regular rate and rhythm. ABDOMEN:  Tenderness in the Left Upper Quadrant. Soft, nondistended, gravid.  EXTREMITIES: Nontender, no edema, 2+ distal pulses. DTR's 2+ FHT:  Baseline rate 140 bpm   Variability moderate  Accelerations absent   Decelerations none Contractions: None   Labs: No results found for this or any previous visit (from the past 24 hour(s)). Imaging Studies:  No results found.  Assessment: Sheila Brooks is  28 y.o. [id number] at 4876w2d presents with chief complaint of Left sided abdominal Pain. History and P.E suggests Musculo-Skeletal Origin as cause of Pain. Visual changes and dizziness possibly caused by dehydration   Plan: Patient declined prescription for Muscle Relaxant. Prefers to manage pain with tylenol Advised to increase oral fluid intake Advised to start prenatal care as soon as possible  Sheila Brooks, Sheila Brooks 2/5/20156:40 PM

## 2013-06-07 NOTE — Discharge Instructions (Signed)
Dehydration, Adult  Dehydration means your body does not have as much fluid as it needs. Your kidneys, brain, and heart will not work properly without the right amount of fluids and salt.   HOME CARE   Ask your doctor how to replace body fluid losses (rehydrate).   Drink enough fluids to keep your pee (urine) clear or pale yellow.   Drink small amounts of fluids often if you feel sick to your stomach (nauseous) or throw up (vomit).   Eat like you normally do.   Avoid:   Foods or drinks high in sugar.   Bubbly (carbonated) drinks.   Juice.   Very hot or cold fluids.   Drinks with caffeine.   Fatty, greasy foods.   Alcohol.   Tobacco.   Eating too much.   Gelatin desserts.   Wash your hands to avoid spreading germs (bacteria, viruses).   Only take medicine as told by your doctor.   Keep all doctor visits as told.  GET HELP RIGHT AWAY IF:    You cannot drink something without throwing up.   You get worse even with treatment.   Your vomit has blood in it or looks greenish.   Your poop (stool) has blood in it or looks black and tarry.   You have not peed in 6 to 8 hours.   You pee a small amount of very dark pee.   You have a fever.   You pass out (faint).   You have belly (abdominal) pain that gets worse or stays in one spot (localizes).   You have a rash, stiff neck, or bad headache.   You get easily annoyed, sleepy, or are hard to wake up.   You feel weak, dizzy, or very thirsty.  MAKE SURE YOU:    Understand these instructions.   Will watch your condition.   Will get help right away if you are not doing well or get worse.  Document Released: 02/13/2009 Document Revised: 07/12/2011 Document Reviewed: 12/07/2010  [employer] Patient Information 2014 [employer].

## 2013-06-07 NOTE — MAU Note (Signed)
Patient states she has been seeing spots for a couple of days.

## 2014-03-04 ENCOUNTER — Encounter (HOSPITAL_COMMUNITY): Payer: Self-pay

## 2014-04-12 ENCOUNTER — Encounter (HOSPITAL_COMMUNITY): Payer: Self-pay | Admitting: *Deleted
